# Patient Record
Sex: Female | Born: 1965 | Race: White | Hispanic: No | Smoking: Never smoker
Health system: Southern US, Community
[De-identification: ages and names within clinical notes are randomized; demographics above are authoritative.]

## PROBLEM LIST (undated history)

## (undated) DIAGNOSIS — C801 Malignant (primary) neoplasm, unspecified: Secondary | ICD-10-CM

## (undated) DIAGNOSIS — I10 Essential (primary) hypertension: Secondary | ICD-10-CM

## (undated) DIAGNOSIS — K219 Gastro-esophageal reflux disease without esophagitis: Secondary | ICD-10-CM

## (undated) DIAGNOSIS — Z923 Personal history of irradiation: Secondary | ICD-10-CM

## (undated) HISTORY — PX: BREAST CYST ASPIRATION: SHX578

## (undated) HISTORY — PX: ABDOMINAL HYSTERECTOMY: SHX81

## (undated) HISTORY — PX: APPENDECTOMY: SHX54

## (undated) HISTORY — PX: CHOLECYSTECTOMY: SHX55

## (undated) HISTORY — PX: THYROIDECTOMY: SHX17

## (undated) HISTORY — PX: KNEE SURGERY: SHX244

---

## 2017-05-18 ENCOUNTER — Encounter: Payer: Self-pay | Admitting: Emergency Medicine

## 2017-05-18 ENCOUNTER — Other Ambulatory Visit: Payer: Self-pay

## 2017-05-18 ENCOUNTER — Ambulatory Visit
Admission: EM | Admit: 2017-05-18 | Discharge: 2017-05-18 | Disposition: A | Discharge status: Home / Self Care | Payer: BC Managed Care – PPO | Attending: Family Medicine | Admitting: Family Medicine

## 2017-05-18 ENCOUNTER — Ambulatory Visit: Admission: EM | Admit: 2017-05-18 | Discharge: 2017-05-18 | Discharge status: Absent without leave | Payer: Self-pay

## 2017-05-18 DIAGNOSIS — N3001 Acute cystitis with hematuria: Secondary | ICD-10-CM | POA: Diagnosis not present

## 2017-05-18 HISTORY — DX: Gastro-esophageal reflux disease without esophagitis: K21.9

## 2017-05-18 HISTORY — DX: Essential (primary) hypertension: I10

## 2017-05-18 HISTORY — DX: Malignant (primary) neoplasm, unspecified: C80.1

## 2017-05-18 LAB — URINALYSIS, COMPLETE (UACMP) WITH MICROSCOPIC
BACTERIA UA: NONE SEEN
Bilirubin Urine: NEGATIVE
GLUCOSE, UA: NEGATIVE mg/dL
KETONES UR: NEGATIVE mg/dL
Nitrite: NEGATIVE
PH: 6 (ref 5.0–8.0)
Protein, ur: NEGATIVE mg/dL
Specific Gravity, Urine: 1.025 (ref 1.005–1.030)

## 2017-05-18 MED ORDER — CEPHALEXIN 500 MG PO CAPS: 500.0000 mg | ORAL_CAPSULE | Freq: Two times a day (BID) | ORAL | 0 refills | Status: DC

## 2017-05-18 NOTE — ED Provider Notes (Signed)
[facility]    CSN: [id number] Arrival date & time: 05/18/17  1517  History   Chief Complaint Chief Complaint  Patient presents with  . Urinary Frequency   HPI  51 year old female presents with concern for UTI.  Patient reports that her symptoms started 3 days ago.  She has had urinary frequency and urgency.  Mild dysuria.  Dysuria seems to be worsening.  No associated fevers or chills.  No flank pain.  No medications or interventions tried.  No known exacerbating relieving factors.  She denies abdominal pain.  Symptoms are moderate in severity.  No other associated symptoms.  No other complaints or concerns at this time.  Past Medical History:  Diagnosis Date  . Cancer ([place])    thyroid  . GERD (gastroesophageal reflux disease)   . Hypertension    Past Surgical History:  Procedure Laterality Date  . ABDOMINAL HYSTERECTOMY    . APPENDECTOMY    . CHOLECYSTECTOMY    . KNEE SURGERY Bilateral   . THYROIDECTOMY     OB History    No data available     Home Medications    Prior to Admission medications   Medication Sig Start Date End Date Taking? Authorizing Provider  Amlodipine Besylate-Valsartan (EXFORGE PO) Take by mouth.   Yes [provider]  Dexlansoprazole (DEXILANT PO) Take by mouth daily.   Yes [provider]  levothyroxine (SYNTHROID, LEVOTHROID) 137 MCG tablet Take 137 mcg by mouth daily before breakfast.   Yes [provider]    Family History Family History  Problem Relation Age of Onset  . Cancer Mother        breast  . Hypertension Mother   . Cancer Father        thyroid  . Hypertension Father    Social History Social History   Tobacco Use  . Smoking status: Never Smoker  . Smokeless tobacco: Never Used  Substance Use Topics  . Alcohol use: No    Frequency: Never  . Drug use: No    Allergies   Daypro [oxaprozin]   Review of Systems Review of Systems  Constitutional: Negative.   Gastrointestinal:  Negative for abdominal pain.  Genitourinary: Positive for dysuria, frequency and urgency. Negative for flank pain.  Musculoskeletal: Negative for back pain.  All other systems reviewed and are negative.   Physical Exam Triage Vital Signs ED Triage Vitals  Enc Vitals Group     BP 05/18/17 1612 120/66     Pulse Rate 05/18/17 1612 67     Resp 05/18/17 1612 16     Temp 05/18/17 1612 98.2 F (36.8 C)     Temp Source 05/18/17 1612 Oral     SpO2 05/18/17 1612 98 %     Weight --      Height 05/18/17 1611 5\' 3"  (1.6 m)     Head Circumference --      Peak Flow --      Pain Score 05/18/17 1612 6     Pain Loc --      Pain Edu? --      Excl. in [place]? --    No data found.  Updated Vital Signs BP 120/66 (BP Location: Left Arm)   Pulse 67   Temp 98.2 F (36.8 C) (Oral)   Resp 16   Ht 5\' 3"  (1.6 m)   SpO2 98%   Visual Acuity Right Eye Distance:   Left Eye Distance:   Bilateral Distance:    Right  Eye Near:   Left Eye Near:    Bilateral Near:     Physical Exam  Constitutional: She is oriented to person, place, and time. She appears well-developed. No distress.  Sitting in wheelchair.  HENT:  Head: Normocephalic and atraumatic.  Nose: Nose normal.  Eyes: Conjunctivae are normal. No scleral icterus.  Cardiovascular: Normal rate and regular rhythm.  2/6 systolic murmur.  Pulmonary/Chest: Effort normal and breath sounds normal. She has no wheezes. She has no rales.  Abdominal: Soft. She exhibits no distension. There is no tenderness.  Neurological: She is alert and oriented to person, place, and time.  Normal speech.  Normal tone.  No apparent focal deficits.  Skin: Skin is warm. No rash noted.  Psychiatric: She has a normal mood and affect. Her behavior is normal.  Vitals reviewed.  UC Treatments / Results  Labs (all labs ordered are listed, but only abnormal results are displayed) Labs Reviewed  URINALYSIS, COMPLETE (UACMP) WITH MICROSCOPIC - Abnormal; Notable for the  following components:      Result Value   Color, Urine STRAW (*)    APPearance HAZY (*)    Hgb urine dipstick MODERATE (*)    Leukocytes, UA SMALL (*)    Squamous Epithelial / LPF 0-5 (*)    All other components within normal limits  URINE CULTURE    EKG  EKG Interpretation None       Radiology No results found.  Procedures Procedures (including critical care time)  Medications Ordered in UC Medications - No data to display   Initial Impression / Assessment and Plan / UC Course  I have reviewed the triage vital signs and the nursing notes.  Pertinent labs & imaging results that were available during my care of the patient were reviewed by me and considered in my medical decision making (see chart for details).    51 year old female presents with signs and symptoms of acute cystitis.  Treating with Keflex.  Sending culture.  Final Clinical Impressions(s) / UC Diagnoses   Final diagnoses:  Acute cystitis with hematuria    ED Discharge Orders        Ordered    cephALEXin (KEFLEX) 500 MG capsule  2 times daily     05/18/17 1646     Controlled Substance Prescriptions [place] Controlled Substance Registry consulted? Not Applicable   Coral Spikes, DO 05/18/17 1701

## 2017-05-18 NOTE — ED Triage Notes (Signed)
Patient in today c/o urinary frequency, urgency and pain x 3 days. Patient denies fever.

## 2017-05-20 LAB — URINE CULTURE: Culture: <10000 — AB

## 2017-10-09 ENCOUNTER — Emergency Department: Payer: BC Managed Care – PPO

## 2017-10-09 ENCOUNTER — Emergency Department
Admission: EM | Admit: 2017-10-09 | Discharge: 2017-10-09 | Disposition: A | Discharge status: Home / Self Care | Payer: BC Managed Care – PPO | Attending: Emergency Medicine | Admitting: Emergency Medicine

## 2017-10-09 ENCOUNTER — Encounter: Payer: Self-pay | Admitting: Emergency Medicine

## 2017-10-09 ENCOUNTER — Other Ambulatory Visit: Payer: Self-pay

## 2017-10-09 DIAGNOSIS — R1013 Epigastric pain: Secondary | ICD-10-CM | POA: Diagnosis present

## 2017-10-09 DIAGNOSIS — R112 Nausea with vomiting, unspecified: Secondary | ICD-10-CM | POA: Insufficient documentation

## 2017-10-09 DIAGNOSIS — R197 Diarrhea, unspecified: Secondary | ICD-10-CM | POA: Insufficient documentation

## 2017-10-09 DIAGNOSIS — Z79899 Other long term (current) drug therapy: Secondary | ICD-10-CM | POA: Insufficient documentation

## 2017-10-09 DIAGNOSIS — I10 Essential (primary) hypertension: Secondary | ICD-10-CM | POA: Diagnosis not present

## 2017-10-09 LAB — COMPREHENSIVE METABOLIC PANEL
ALT: 24 U/L (ref 14–54)
AST: 24 U/L (ref 15–41)
Albumin: 4.2 g/dL (ref 3.5–5.0)
Alkaline Phosphatase: 89 U/L (ref 38–126)
Anion gap: 7 (ref 5–15)
BUN: 25 mg/dL — AB (ref 6–20)
CHLORIDE: 104 mmol/L (ref 101–111)
CO2: 29 mmol/L (ref 22–32)
CREATININE: 0.76 mg/dL (ref 0.44–1.00)
Calcium: 9 mg/dL (ref 8.9–10.3)
GFR calc Af Amer: >60 mL/min (ref >60)
GFR calc non Af Amer: >60 mL/min (ref >60)
Glucose, Bld: 90 mg/dL (ref 65–99)
Potassium: 3.7 mmol/L (ref 3.5–5.1)
SODIUM: 140 mmol/L (ref 135–145)
Total Bilirubin: 0.7 mg/dL (ref 0.3–1.2)
Total Protein: 7.3 g/dL (ref 6.5–8.1)

## 2017-10-09 LAB — CBC
HEMATOCRIT: 42.9 % (ref 35.0–47.0)
Hemoglobin: 14.6 g/dL (ref 12.0–16.0)
MCH: 30.6 pg (ref 26.0–34.0)
MCHC: 34 g/dL (ref 32.0–36.0)
MCV: 90.1 fL (ref 80.0–100.0)
PLATELETS: 281 10*3/uL (ref 150–440)
RBC: 4.77 MIL/uL (ref 3.80–5.20)
RDW: 14.1 % (ref 11.5–14.5)
WBC: 11.4 10*3/uL — ABNORMAL HIGH (ref 3.6–11.0)

## 2017-10-09 LAB — LIPASE, BLOOD: LIPASE: 30 U/L (ref 11–51)

## 2017-10-09 MED ORDER — SODIUM CHLORIDE 0.9 % IV BOLUS
1000.0000 mL | Freq: Once | INTRAVENOUS | Status: AC
  Administered 2017-10-09: 1000 mL via INTRAVENOUS

## 2017-10-09 MED ORDER — ONDANSETRON 4 MG PO TBDP: 4.0000 mg | ORAL_TABLET | Freq: Three times a day (TID) | ORAL | 0 refills | Status: AC | PRN

## 2017-10-09 MED ORDER — ONDANSETRON HCL 4 MG/2ML IJ SOLN
4.0000 mg | Freq: Once | INTRAMUSCULAR | Status: AC
  Administered 2017-10-09: 4 mg via INTRAVENOUS
  Filled 2017-10-09: qty 2

## 2017-10-09 MED ORDER — IOPAMIDOL (ISOVUE-300) INJECTION 61%
100.0000 mL | Freq: Once | INTRAVENOUS | Status: AC | PRN
  Administered 2017-10-09: 100 mL via INTRAVENOUS

## 2017-10-09 MED ORDER — DIPHENHYDRAMINE HCL 50 MG/ML IJ SOLN
25.0000 mg | Freq: Once | INTRAMUSCULAR | Status: AC
  Administered 2017-10-09: 25 mg via INTRAVENOUS
  Filled 2017-10-09: qty 1

## 2017-10-09 MED ORDER — KETOROLAC TROMETHAMINE 30 MG/ML IJ SOLN
15.0000 mg | INTRAMUSCULAR | Status: AC
  Administered 2017-10-09: 15 mg via INTRAVENOUS
  Filled 2017-10-09: qty 1

## 2017-10-09 NOTE — ED Provider Notes (Signed)
-----------------------------------------   5:48 PM on 10/09/2017 -----------------------------------------   Blood pressure 111/76, pulse 89, temperature 99.2 F (37.3 C), temperature source Oral, resp. rate 20, height 5\' 3"  (1.6 m), weight 95.3 kg (210 lb), SpO2 95 %.  Assuming care from Dr. Joni Fears of [facility] Ohlsen is a 52 y.o. female with a chief complaint of Abdominal Pain .    Please refer to H&P by previous MD for further details.  The current plan of care is to reassess after fluids and antiemetic.  Patient reports that she feels markedly improved, abdomen remains soft and nontender, CT and labs with no acute findings.  She is tolerating p.o.  Patient can be discharged home on Zofran and follow-up with primary care doctor.  Discussed return precautions with her.     Alfred Levins, [place], MD 10/09/17 [phone]

## 2017-10-09 NOTE — Discharge Instructions (Addendum)

## 2017-10-09 NOTE — ED Provider Notes (Signed)
[facility] Emergency Department Provider Note  ____________________________________________  Time seen: Approximately 3:34 PM  I have reviewed the triage vital signs and the nursing notes.   HISTORY  Chief Complaint Abdominal Pain    HPI Marisa Marquez is a 52 y.o. female with a history of hypertension and GERD who complains of gradual onset of epigastric pain for the past 2 days. Constant, waxing and waning, dull aching. Nonradiating. Associated with nausea and vomiting and diarrhea which is worse with any oral intake. No alleviating factors.      Past Medical History:  Diagnosis Date  . Cancer ([place])    thyroid  . GERD (gastroesophageal reflux disease)   . Hypertension      There are no active problems to display for this patient.    Past Surgical History:  Procedure Laterality Date  . ABDOMINAL HYSTERECTOMY    . APPENDECTOMY    . CHOLECYSTECTOMY    . KNEE SURGERY Bilateral   . THYROIDECTOMY       Prior to Admission medications   Medication Sig Start Date End Date Taking? Authorizing Provider  albuterol (PROAIR HFA) 108 (90 Base) MCG/ACT inhaler Inhale 2 puffs into the lungs every 4 (four) hours as needed for wheezing or shortness of breath.  07/27/16  Yes [provider]  dexlansoprazole (DEXILANT) 60 MG capsule Take 1 capsule by mouth daily.    Yes [provider]  levothyroxine (SYNTHROID, LEVOTHROID) 125 MCG tablet Take 125 mcg by mouth daily before breakfast.    Yes [provider]  meloxicam (MOBIC) 15 MG tablet Take 15 mg by mouth daily. 10/02/17  Yes [provider]  oxyCODONE-acetaminophen (PERCOCET) 5-325 MG tablet Take 1 tablet by mouth every 6 (six) hours as needed for moderate pain.    Yes [provider]  Testosterone 10 MG/ACT (2%) GEL Apply 4 Pump topically every morning. 07/31/17  Yes [provider]  valsartan-hydrochlorothiazide (DIOVAN-HCT) 320-25 MG tablet Take 1 tablet by  mouth daily. 09/13/17  Yes [provider]     Allergies Daypro [oxaprozin]   Family History  Problem Relation Age of Onset  . Cancer Mother        breast  . Hypertension Mother   . Cancer Father        thyroid  . Hypertension Father     Social History Social History   Tobacco Use  . Smoking status: Never Smoker  . Smokeless tobacco: Never Used  Substance Use Topics  . Alcohol use: No    Frequency: Never  . Drug use: No    Review of Systems  Constitutional:   No fever or chills.  ENT:   No sore throat. No rhinorrhea. Cardiovascular:   No chest pain or syncope. Respiratory:   No dyspnea or cough. Gastrointestinal:   positive as above for abdominal pain and vomiting and diarrhea.  Musculoskeletal:   Negative for focal pain or swelling All other systems reviewed and are negative except as documented above in ROS and HPI.  ____________________________________________   PHYSICAL EXAM:  VITAL SIGNS: ED Triage Vitals  Enc Vitals Group     BP --      Pulse Rate 10/09/17 1325 85     Resp 10/09/17 1325 16     Temp 10/09/17 1325 99.2 F (37.3 C)     Temp Source 10/09/17 1325 Oral     SpO2 10/09/17 1325 98 %     Weight 10/09/17 1326 210 lb (95.3 kg)  Height 10/09/17 1326 5\' 3"  (1.6 m)     Head Circumference --      Peak Flow --      Pain Score 10/09/17 1326 10     Pain Loc --      Pain Edu? --      Excl. in [place]? --     Vital signs reviewed, nursing assessments reviewed.   Constitutional:   Alert and oriented. not in distress. Eyes:   Conjunctivae are normal. EOMI. PERRL. ENT      Head:   Normocephalic and atraumatic.      Nose:   No congestion/rhinnorhea.       Mouth/Throat:   MMM, no pharyngeal erythema. No peritonsillar mass.       Neck:   No meningismus. Full ROM. Hematological/Lymphatic/Immunilogical:   No cervical lymphadenopathy. Cardiovascular:   RRR. Symmetric bilateral radial and DP pulses.  No murmurs.  Respiratory:   Normal  respiratory effort without tachypnea/retractions. Breath sounds are clear and equal bilaterally. No wheezes/rales/rhonchi. Gastrointestinal:   Soft with generalized tenderness. Non distended. There is no CVA tenderness.  No rebound, rigidity, or guarding.  Musculoskeletal:   Normal range of motion in all extremities. No joint effusions.  No lower extremity tenderness.  No edema. Neurologic:   Normal speech and language.  Motor grossly intact. No acute focal neurologic deficits are appreciated.  Skin:    Skin is warm, dry and intact. No rash noted.  No petechiae, purpura, or bullae.  ____________________________________________    LABS (pertinent positives/negatives) (all labs ordered are listed, but only abnormal results are displayed) Labs Reviewed  COMPREHENSIVE METABOLIC PANEL - Abnormal; Notable for the following components:      Result Value   BUN 25 (*)    All other components within normal limits  CBC - Abnormal; Notable for the following components:   WBC 11.4 (*)    All other components within normal limits  LIPASE, BLOOD  URINALYSIS, COMPLETE (UACMP) WITH MICROSCOPIC   ____________________________________________   EKG  interpreted by me Sinus rhythm rate of 86, normal axis and intervals. Normal QRS ST segments and T waves.  ____________________________________________    RADIOLOGY  Ct Abdomen Pelvis W Contrast  Result Date: 10/09/2017 CLINICAL DATA:  52 y/o F; sudden onset abdominal pain with nausea, vomiting, and diarrhea for 2 days. EXAM: CT ABDOMEN AND PELVIS WITH CONTRAST TECHNIQUE: Multidetector CT imaging of the abdomen and pelvis was performed using the standard protocol following bolus administration of intravenous contrast. CONTRAST:  166mL ISOVUE-300 IOPAMIDOL (ISOVUE-300) INJECTION 61% COMPARISON:  None. FINDINGS: Lower chest: Small hiatal hernia. Hepatobiliary: No focal liver abnormality is seen. Status post cholecystectomy. No biliary dilatation.  Pancreas: Unremarkable. No pancreatic ductal dilatation or surrounding inflammatory changes. Spleen: Normal in size without focal abnormality. Adrenals/Urinary Tract: Adrenal glands are unremarkable. Multiple renal cysts measuring up to 12 mm in left upper pole. Otherwise kidneys are normal, without renal calculi, focal lesion, or hydronephrosis. Bladder is unremarkable. Stomach/Bowel: Stomach is within normal limits. Appendectomy and partial sigmoid colectomy chronic postsurgical changes. No evidence of bowel wall thickening, distention, or inflammatory changes. Scattered colonic diverticulosis, no findings of acute diverticulitis. Vascular/Lymphatic: No significant vascular findings are present. No enlarged abdominal or pelvic lymph nodes. Reproductive: Uterus and bilateral adnexa are unremarkable. Other: No abdominal wall hernia or abnormality. No abdominopelvic ascites. Musculoskeletal: No fracture is seen. L5-S1 loss of disc space height in facet hypertrophy with left-greater-than-right foraminal stenosis. IMPRESSION: 1. No acute process identified. 2. Small hiatal hernia. 3. Scattered  diverticulosis, no findings of acute diverticulitis. 4. 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 Electronically Signed   By: Kristine Garbe M.D.   On: 10/09/2017 14:42    ____________________________________________   PROCEDURES Procedures  ____________________________________________  DIFFERENTIAL DIAGNOSIS   pancreatitis, bowel obstruction, diverticulitis  CLINICAL IMPRESSION / ASSESSMENT AND PLAN / ED COURSE  Pertinent labs & imaging results that were available during my care of the patient were reviewed by me and considered in my medical decision making (see chart for details).    patient presents with his abdominal pain and vomiting. Likely viral syndrome given cluster of activity was seen in this community recently. However, her past medical history and surgical  history are concerning for a host of intra-abdominal pathologies, so a CT scan is being obtained. We'll give IV fluids for hydration, antiemetics for symptom relief.  Clinical Course as of Oct 09 1532  Tue Oct 09, 2017  1445 CT a/p negative. Labs reassuring. Will tx for gerd, plan for outpatient follow up.    [PS]    Clinical Course User Index [PS] Carrie Mew, MD     ----------------------------------------- 3:40 PM on 10/09/2017 -----------------------------------------  Overall workup negative. Labs unremarkable. CT doesn't show any evidence of inflammation, abscess, internal hernia or volvulus. I doubt mesenteric ischemia. We'll treat symptomatically with hydration, antacids and antiemetics, plan for outpatient follow-up if symptoms can be controlled. His be signed out to Dr. Alfred Levins to follow-up on clinical improvement.  ____________________________________________   FINAL CLINICAL IMPRESSION(S) / ED DIAGNOSES    Final diagnoses:  Epigastric pain  Nausea vomiting and diarrhea     ED Discharge Orders    None      Portions of this note were generated with dragon dictation software. Dictation errors may occur despite best attempts at proofreading.    Carrie Mew, MD 10/09/17 1540

## 2017-10-09 NOTE — ED Triage Notes (Signed)
Pt in via POV from home with complaints of sudden onset abdominal pain w/ N/V/D x 2 days.  Pt with hx of diverticulitis with complications.  Vitals WDL.  NAD noted at this time.

## 2018-04-23 ENCOUNTER — Other Ambulatory Visit: Payer: Self-pay | Admitting: Family Medicine

## 2018-04-23 DIAGNOSIS — Z1231 Encounter for screening mammogram for malignant neoplasm of breast: Secondary | ICD-10-CM

## 2018-04-28 ENCOUNTER — Emergency Department
Admission: EM | Admit: 2018-04-28 | Discharge: 2018-04-28 | Disposition: A | Discharge status: Home / Self Care | Payer: BC Managed Care – PPO | Attending: Emergency Medicine | Admitting: Emergency Medicine

## 2018-04-28 ENCOUNTER — Other Ambulatory Visit: Payer: Self-pay

## 2018-04-28 ENCOUNTER — Encounter: Payer: Self-pay | Admitting: Emergency Medicine

## 2018-04-28 ENCOUNTER — Emergency Department: Payer: BC Managed Care – PPO

## 2018-04-28 DIAGNOSIS — Z79899 Other long term (current) drug therapy: Secondary | ICD-10-CM | POA: Diagnosis not present

## 2018-04-28 DIAGNOSIS — I10 Essential (primary) hypertension: Secondary | ICD-10-CM | POA: Diagnosis not present

## 2018-04-28 DIAGNOSIS — S8992XA Unspecified injury of left lower leg, initial encounter: Secondary | ICD-10-CM | POA: Diagnosis present

## 2018-04-28 DIAGNOSIS — Y929 Unspecified place or not applicable: Secondary | ICD-10-CM | POA: Insufficient documentation

## 2018-04-28 DIAGNOSIS — Z85850 Personal history of malignant neoplasm of thyroid: Secondary | ICD-10-CM | POA: Diagnosis not present

## 2018-04-28 DIAGNOSIS — X509XXA Other and unspecified overexertion or strenuous movements or postures, initial encounter: Secondary | ICD-10-CM | POA: Insufficient documentation

## 2018-04-28 DIAGNOSIS — Y939 Activity, unspecified: Secondary | ICD-10-CM | POA: Diagnosis not present

## 2018-04-28 DIAGNOSIS — Y999 Unspecified external cause status: Secondary | ICD-10-CM | POA: Insufficient documentation

## 2018-04-28 DIAGNOSIS — S8392XA Sprain of unspecified site of left knee, initial encounter: Secondary | ICD-10-CM | POA: Diagnosis not present

## 2018-04-28 DIAGNOSIS — S86912A Strain of unspecified muscle(s) and tendon(s) at lower leg level, left leg, initial encounter: Secondary | ICD-10-CM

## 2018-04-28 MED ORDER — OXYCODONE-ACETAMINOPHEN 5-325 MG PO TABS
1.0000 | ORAL_TABLET | Freq: Once | ORAL | Status: AC
  Administered 2018-04-28: 1 via ORAL
  Filled 2018-04-28: qty 1

## 2018-04-28 MED ORDER — DICLOFENAC SODIUM 50 MG PO TBEC: 50.0000 mg | DELAYED_RELEASE_TABLET | Freq: Two times a day (BID) | ORAL | 0 refills | Status: AC

## 2018-04-28 MED ORDER — CYCLOBENZAPRINE HCL 5 MG PO TABS: 5.0000 mg | ORAL_TABLET | Freq: Three times a day (TID) | ORAL | 0 refills | Status: AC | PRN

## 2018-04-28 NOTE — ED Triage Notes (Signed)
Pt to ED via POV c/o left knee pain. Pt states that her knee popped when she got in the truck early and she has been having severe pain the last 1 hour. Pt is in NAD at this time.

## 2018-04-28 NOTE — ED Provider Notes (Signed)
[facility] Emergency Department Provider Note ____________________________________________  Time seen: 1526  I have reviewed the triage vital signs and the nursing notes.  HISTORY  Chief Complaint  Knee Pain  HPI Marisa Marquez is a 52 y.o. female present to the ED accompanied by her husband, for evaluation of left knee pain.  Patient describes pain to the posterior lateral aspect of the left knee when she was attempting to get into the truck this afternoon.  She describes pushing off on her right foot and her left leg was dangling, when she extended it, she felt an immediate pop and pain to the posterior knee.  She gives a history of about a 2-week complaint of intermittent pain to the same knee that she would localized to be under the kneecap.  She denies any recent injury, trauma, slip, trip, fall.  Does give a remote history of left knee surgery for what sounds like a meniscal repair.  She attempted to call the orthopedic provider for follow-up, but they were unable to see her related to her previous complaints of left knee pain.  Past Medical History:  Diagnosis Date  . Cancer ([place])    thyroid  . GERD (gastroesophageal reflux disease)   . Hypertension     There are no active problems to display for this patient.   Past Surgical History:  Procedure Laterality Date  . ABDOMINAL HYSTERECTOMY    . APPENDECTOMY    . CHOLECYSTECTOMY    . KNEE SURGERY Bilateral   . THYROIDECTOMY      Prior to Admission medications   Medication Sig Start Date End Date Taking? Authorizing Provider  albuterol (PROAIR HFA) 108 (90 Base) MCG/ACT inhaler Inhale 2 puffs into the lungs every 4 (four) hours as needed for wheezing or shortness of breath.  07/27/16   [provider]  cyclobenzaprine (FLEXERIL) 5 MG tablet Take 1 tablet (5 mg total) by mouth 3 (three) times daily as needed. 04/28/18   Iysis Germain, Dannielle Karvonen, PA-C  dexlansoprazole (DEXILANT) 60 MG capsule Take  1 capsule by mouth daily.     [provider]  diclofenac (VOLTAREN) 50 MG EC tablet Take 1 tablet (50 mg total) by mouth 2 (two) times daily for 15 days. 04/28/18 05/13/18  Lavonia Eager, Dannielle Karvonen, PA-C  levothyroxine (SYNTHROID, LEVOTHROID) 125 MCG tablet Take 125 mcg by mouth daily before breakfast.     [provider]  meloxicam (MOBIC) 15 MG tablet Take 15 mg by mouth daily. 10/02/17   [provider]  ondansetron (ZOFRAN ODT) 4 MG disintegrating tablet Take 1 tablet (4 mg total) by mouth every 8 (eight) hours as needed for nausea or vomiting. 10/09/17   Alfred Levins, [place], MD  oxyCODONE-acetaminophen (PERCOCET) 5-325 MG tablet Take 1 tablet by mouth every 6 (six) hours as needed for moderate pain.     [provider]  Testosterone 10 MG/ACT (2%) GEL Apply 4 Pump topically every morning. 07/31/17   [provider]  valsartan-hydrochlorothiazide (DIOVAN-HCT) 320-25 MG tablet Take 1 tablet by mouth daily. 09/13/17   [provider]    Allergies Daypro [oxaprozin] and Hydrocodone  Family History  Problem Relation Age of Onset  . Cancer Mother        breast  . Hypertension Mother   . Cancer Father        thyroid  . Hypertension Father     Social History Social History   Tobacco Use  . Smoking status: Never Smoker  .  Smokeless tobacco: Never Used  Substance Use Topics  . Alcohol use: No    Frequency: Never  . Drug use: No    Review of Systems  Constitutional: Negative for fever. Cardiovascular: Negative for chest pain. Respiratory: Negative for shortness of breath. Musculoskeletal: Negative for back pain. Left knee pain  Skin: Negative for rash. Neurological: Negative for headaches, focal weakness or numbness. ____________________________________________  PHYSICAL EXAM:  VITAL SIGNS: ED Triage Vitals  Enc Vitals Group     BP 04/28/18 1509 (!) 149/60     Pulse Rate 04/28/18 1509 (!) 54     Resp --      Temp 04/28/18  1509 98.3 F (36.8 C)     Temp Source 04/28/18 1509 Oral     SpO2 04/28/18 1509 95 %     Weight 04/28/18 1510 200 lb (90.7 kg)     Height 04/28/18 1510 5\' 3"  (1.6 m)     Head Circumference --      Peak Flow --      Pain Score 04/28/18 1512 10     Pain Loc --      Pain Edu? --      Excl. in [place]? --     Constitutional: Alert and oriented. Well appearing and in no distress. Head: Normocephalic and atraumatic. Eyes: Conjunctivae are normal. Normal extraocular movements Cardiovascular: Normal rate, regular rhythm. Normal distal pulses. Respiratory: Normal respiratory effort. No wheezes/rales/rhonchi. Musculoskeletal: left knee without obvious deformity, dislocation, or effusion. Patient with normal, slow flexion/extension ROM. No valgus or varus joint stress. Negative anterior/posterior drawer sign. Tender to palp over the lateral joint at the fibular head. Tender to palp at the lateral hamstring insertion. No popliteal space fullness. No calf or achilles tenderness. Normal ankle exam. Nontender with normal range of motion in all other extremities.  Neurologic:  Normal gait without ataxia. Normal speech and language. No gross focal neurologic deficits are appreciated. Skin:  Skin is warm, dry and intact. No rash noted. Psychiatric: Mood and affect are normal. Patient exhibits appropriate insight and judgment. ____________________________________________   RADIOLOGY  Left Knee  IMPRESSION: Negative. ____________________________________________  PROCEDURES  Procedures Percocet 5-325 mg PO ____________________________________________  INITIAL IMPRESSION / ASSESSMENT AND PLAN / ED COURSE  Patient with ED evaluation of posterior lateral left hamstring pain.  She reports a 2-week complaint of suprapatella knee pain.  She reports an acute injury today to the hamstring today.  X-ray is reassuring as it shows no acute fracture or dislocation.  Patient is referred to Ortho provider at [facility] for further evaluation and management patient is placed in a knee immobilizer for comfort.  A work note is provided for 2 days as requested.  She is given a prescription for diclofenac 50 mg, which she will take in lieu of her meloxicam.  A prescription for Flexeril is also provided.  ____________________________________________  FINAL CLINICAL IMPRESSION(S) / ED DIAGNOSES  Final diagnoses:  Strain of left knee, initial encounter  Sprain of left knee, unspecified ligament, initial encounter      Melvenia Needles, PA-C 04/28/18 1747    Lavonia Drafts, MD 04/28/18 1800

## 2018-04-28 NOTE — ED Notes (Signed)
Applied  By Roselie Skinner

## 2018-04-28 NOTE — Discharge Instructions (Addendum)
Your exam and x-ray are concerning for a possible knee ligament sprain. You also have some signs of strain to the hamstring. Wear the knee brace while out of bed for support. Take the Diclofenac in lieu of your Meloxicam. Take the muscle relaxant as needed. Rest with the leg elevated and apply ice to reduce pain and tightness. Follow-up with your ortho provider next week for further management.

## 2018-04-28 NOTE — ED Triage Notes (Signed)
First Nurse Note:  C/O left leg pain under knee x 2 weeks.  States was straightening leg today and felt / heard a pop, pain worsened.

## 2018-05-13 ENCOUNTER — Ambulatory Visit
Admission: RE | Admit: 2018-05-13 | Discharge: 2018-05-13 | Disposition: A | Discharge status: Home / Self Care | Payer: BC Managed Care – PPO | Source: Ambulatory Visit | Attending: Family Medicine | Admitting: Family Medicine

## 2018-05-13 DIAGNOSIS — Z1231 Encounter for screening mammogram for malignant neoplasm of breast: Secondary | ICD-10-CM | POA: Insufficient documentation

## 2018-05-13 HISTORY — DX: Personal history of irradiation: Z92.3

## 2018-05-27 ENCOUNTER — Other Ambulatory Visit: Payer: Self-pay | Admitting: Family Medicine

## 2018-05-27 DIAGNOSIS — R921 Mammographic calcification found on diagnostic imaging of breast: Secondary | ICD-10-CM

## 2018-05-27 DIAGNOSIS — R928 Other abnormal and inconclusive findings on diagnostic imaging of breast: Secondary | ICD-10-CM

## 2018-05-27 DIAGNOSIS — N6310 Unspecified lump in the right breast, unspecified quadrant: Secondary | ICD-10-CM

## 2018-05-28 ENCOUNTER — Ambulatory Visit
Admission: RE | Admit: 2018-05-28 | Discharge: 2018-05-28 | Disposition: A | Discharge status: Home / Self Care | Payer: BC Managed Care – PPO | Source: Ambulatory Visit | Attending: Family Medicine | Admitting: Family Medicine

## 2018-05-28 DIAGNOSIS — R928 Other abnormal and inconclusive findings on diagnostic imaging of breast: Secondary | ICD-10-CM | POA: Insufficient documentation

## 2018-05-28 DIAGNOSIS — R921 Mammographic calcification found on diagnostic imaging of breast: Secondary | ICD-10-CM

## 2018-05-28 DIAGNOSIS — N6310 Unspecified lump in the right breast, unspecified quadrant: Secondary | ICD-10-CM

## 2019-01-20 IMAGING — MG DIGITAL DIAGNOSTIC BILATERAL MAMMOGRAM WITH TOMO AND CAD
8 series · 8 of 20 positions shown · non-contrast
Comparison: Previous exam(s).

CLINICAL DATA: Screening recall for possible right breast masses
and left breast calcifications.

EXAM:
DIGITAL DIAGNOSTIC BILATERAL MAMMOGRAM WITH CAD AND TOMO
ULTRASOUND RIGHT BREAST

[L CC]
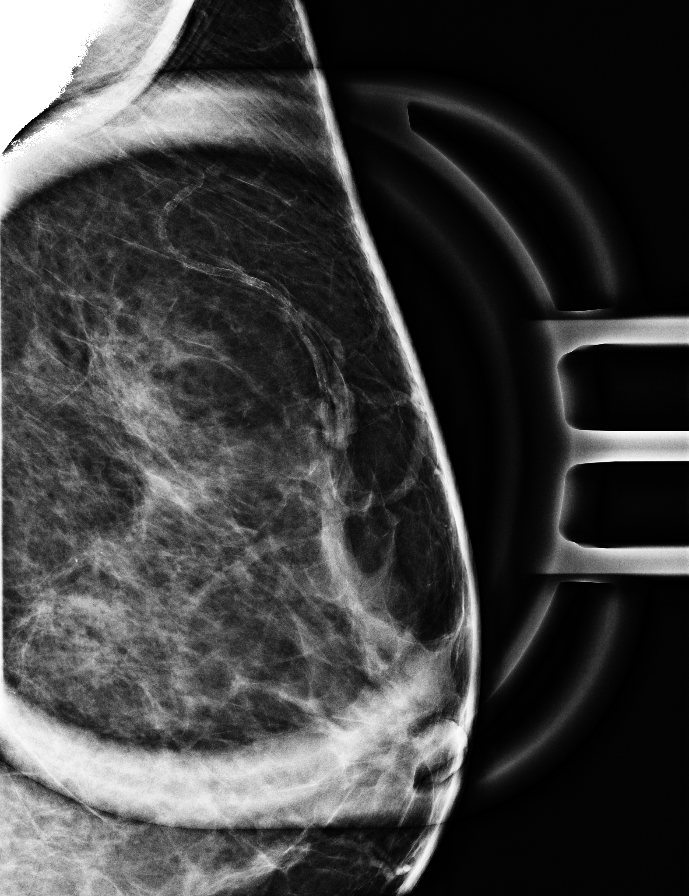

[L ML]
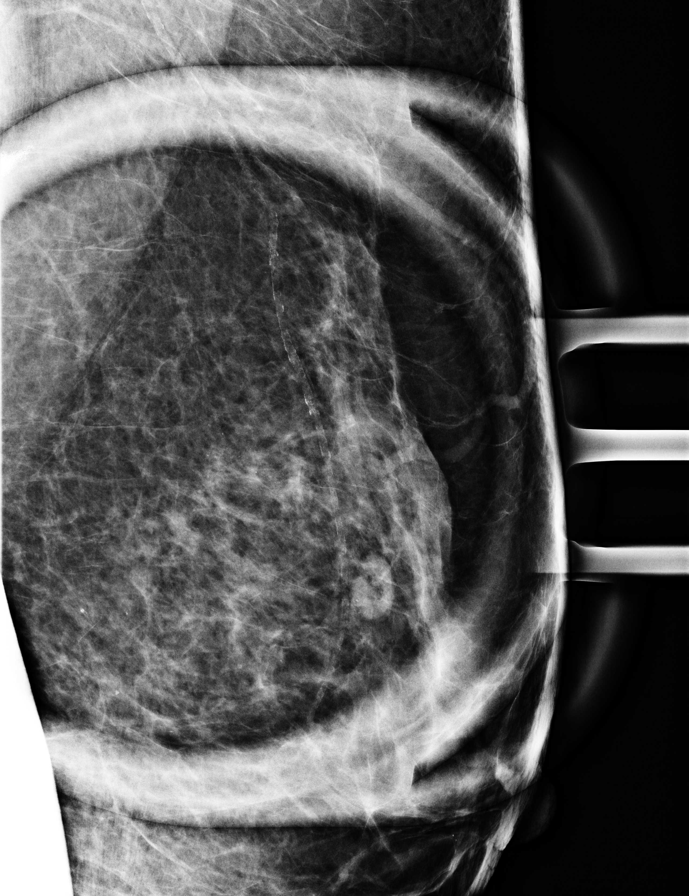

[L ML synth-2D]
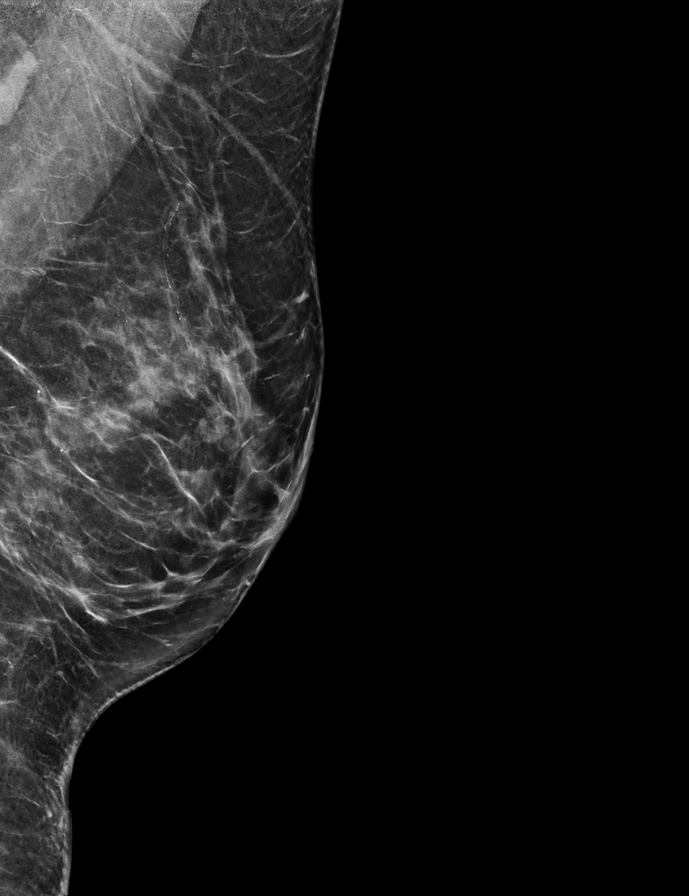

[R CC synth-2D]
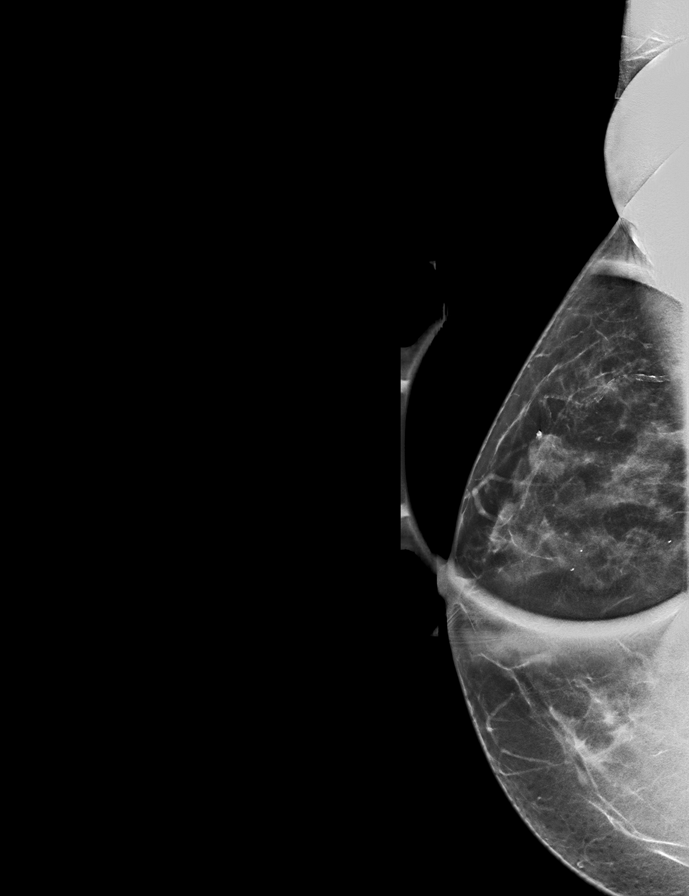

[R MLO synth-2D]
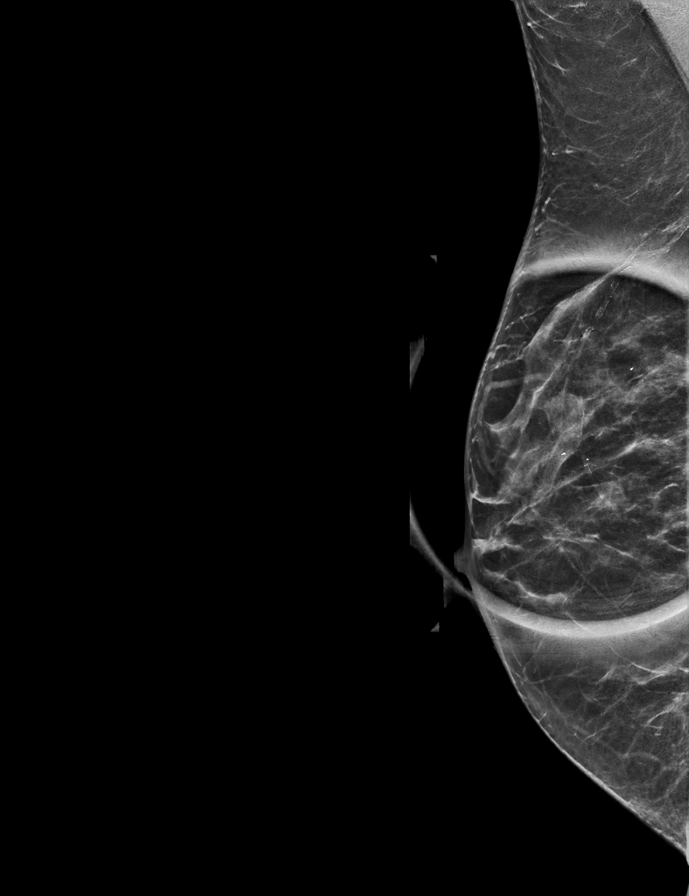

[R CC tomo · tomo slice 27/52.0]
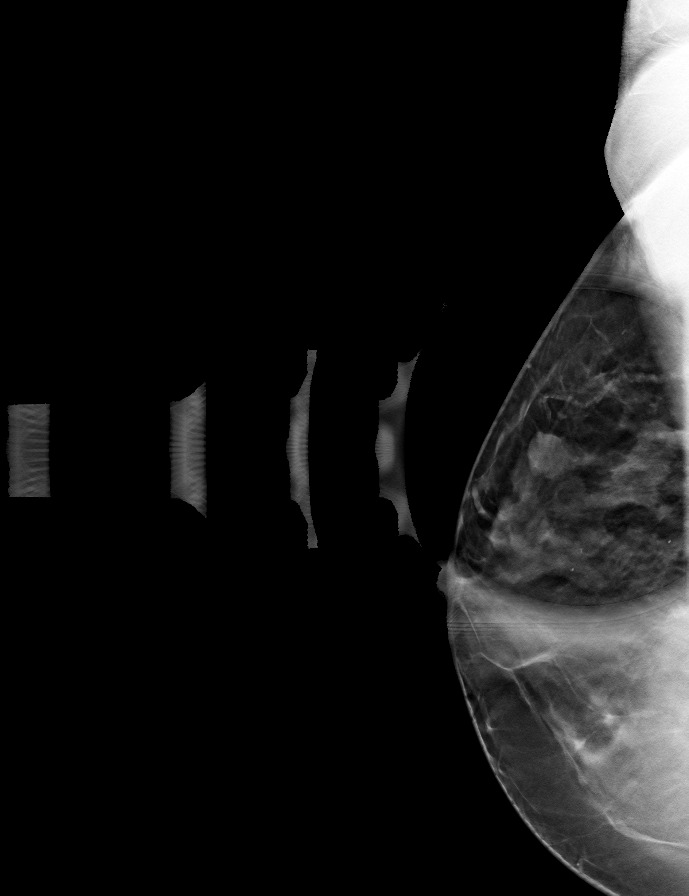

[L ML tomo · tomo slice 35/70.0]
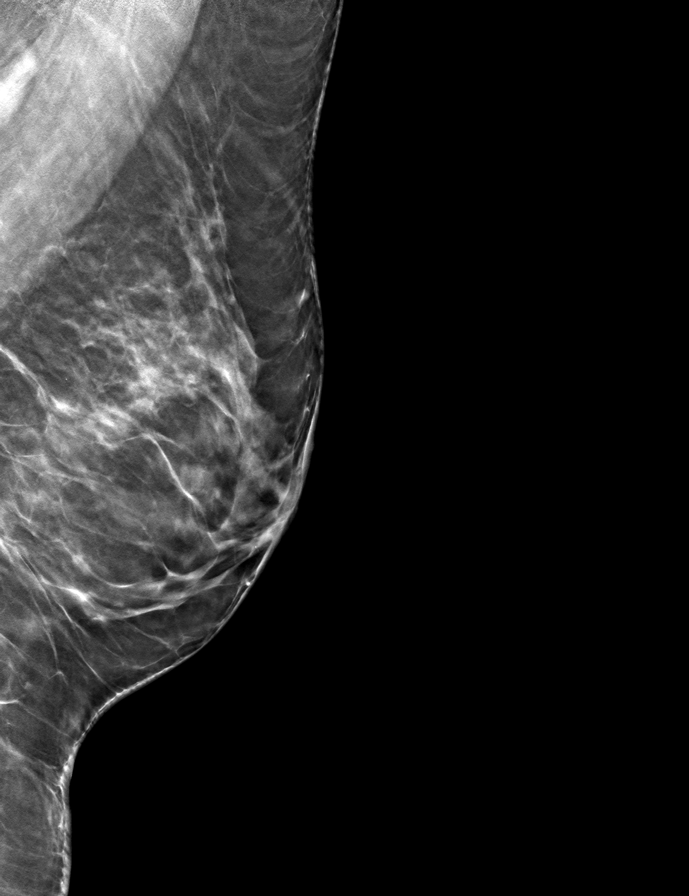

[R MLO tomo · tomo slice 29/56.0]
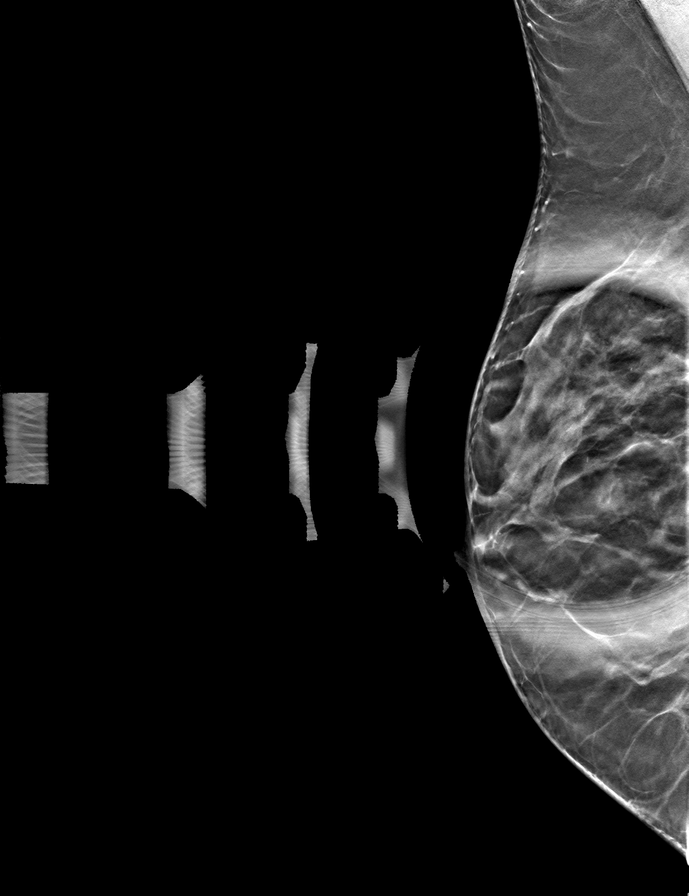

[8 of 20 positions shown; findings below may reference images not displayed]

ACR Breast Density Category c: The breast tissue is heterogeneously
dense, which may obscure small masses.
FINDINGS: Spot compression tomosynthesis images through the upper outer
quadrant of the right breast demonstrates a persistent oval
circumscribed mass measuring approximately 1.0 cm. Spot compression
magnification images of the upper-outer quadrant of the left breast
demonstrate a few scattered calcifications, however no suspicious
grouping of calcifications is identified.

Mammographic images were processed with CAD.

Ultrasound of the right breast at 10 o'clock, 3 cm from the nipple
demonstrates an anechoic oval circumscribed mass with increased
posterior acoustic enhancement measuring 1.0 x 0.9 x 0.9 cm.
IMPRESSION: 1. The right breast mass at 10 o'clock is consistent with a benign
cyst.

2.  The calcifications in the upper-outer left breast are benign.

RECOMMENDATION:
Return to routine screening mammography is recommended. The patient
will be due for screening in .

I have discussed the findings and recommendations with the patient.
Results were also provided in writing at the conclusion of the
visit. If applicable, a reminder letter will be sent to the patient
regarding the next appointment.

BI-RADS CATEGORY  2: Benign.

## 2019-05-02 ENCOUNTER — Other Ambulatory Visit: Payer: Self-pay | Admitting: Family Medicine
